# Patient Record
Sex: Male | Born: 1975 | Race: White | Hispanic: No | Marital: Single | Smoking: Current some day smoker
Health system: Southern US, Community
[De-identification: ages and names within clinical notes are randomized; demographics above are authoritative.]

## PROBLEM LIST (undated history)

## (undated) ENCOUNTER — Ambulatory Visit: Payer: No Typology Code available for payment source

## (undated) DIAGNOSIS — E079 Disorder of thyroid, unspecified: Secondary | ICD-10-CM

## (undated) HISTORY — PX: CYST REMOVAL NECK: SHX6281

---

## 2020-07-03 ENCOUNTER — Ambulatory Visit
Admission: EM | Admit: 2020-07-03 | Discharge: 2020-07-03 | Disposition: A | Discharge status: Home / Self Care | Payer: BC Managed Care – PPO | Attending: Emergency Medicine | Admitting: Emergency Medicine

## 2020-07-03 ENCOUNTER — Other Ambulatory Visit: Payer: Self-pay

## 2020-07-03 ENCOUNTER — Ambulatory Visit (INDEPENDENT_AMBULATORY_CARE_PROVIDER_SITE_OTHER): Payer: BC Managed Care – PPO

## 2020-07-03 ENCOUNTER — Encounter: Payer: Self-pay | Admitting: Emergency Medicine

## 2020-07-03 DIAGNOSIS — S8992XA Unspecified injury of left lower leg, initial encounter: Secondary | ICD-10-CM

## 2020-07-03 DIAGNOSIS — M25562 Pain in left knee: Secondary | ICD-10-CM | POA: Diagnosis not present

## 2020-07-03 DIAGNOSIS — S8002XA Contusion of left knee, initial encounter: Secondary | ICD-10-CM | POA: Diagnosis not present

## 2020-07-03 HISTORY — DX: Disorder of thyroid, unspecified: E07.9

## 2020-07-03 MED ORDER — IBUPROFEN 600 MG PO TABS: 600.0000 mg | ORAL_TABLET | Freq: Four times a day (QID) | ORAL | 0 refills | Status: DC | PRN

## 2020-07-03 NOTE — ED Triage Notes (Signed)
Patient in today after injuring his left knee on 06/26/20. 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 Patient took Ibuprofen on the day of the injury.

## 2020-07-03 NOTE — ED Provider Notes (Signed)
HPI  SUBJECTIVE:  Calvin Greene is a 44 y.o. male who presents with left knee pain for the past week.  Reports knee swelling, bruising that is getting better.  States that he was unhooking his trailer from tractor trailer, the trailer slid back and landed directly on his knee/patella.  He denies thigh or distal leg injury. No distal numbness or tingling, joint erythema.  States that he has been able to bear weight on it and bend his knee although it is painful.  He tried 1200 mg of ibuprofen on the day of injury with improvement of symptoms.  Symptoms are worse with palpation, bending his knee.   Past medical history none for diabetes, hypertension, history of left knee injury.  PMD: [facility].  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  Past Medical History:  Diagnosis Date  . Thyroid disease     Past Surgical History:  Procedure Laterality Date  . CYST REMOVAL NECK      Family History  Problem Relation Age of Onset  . Other Mother        unknown medical history  . Other Father        unknown medical history    Social History   Tobacco Use  . Smoking status: Former Smoker    Types: Cigarettes    Quit date: 07/03/2018    Years since quitting: 2.0  . Smokeless tobacco: Never Used  Vaping Use  . Vaping Use: Never used  Substance Use Topics  . Alcohol use: Never  . Drug use: Never    No current facility-administered medications for this encounter.  Current Outpatient Medications:  .  levothyroxine (SYNTHROID) 137 MCG tablet, Take 1 tablet by mouth daily., Disp: , Rfl:  .  ibuprofen (ADVIL) 600 MG tablet, Take 1 tablet (600 mg total) by mouth every 6 (six) hours as needed., Disp: 30 tablet, Rfl: 0  No Known Allergies   ROS  As noted in HPI.   Physical Exam  BP 105/77 (BP Location: Left Arm)   Pulse 73   Temp 98.2 F (36.8 C) (Oral)   Resp 18   Ht 5\' 11"  (1.803 m)   Wt 65.8 kg   SpO2 100%   BMI 20.22 kg/m   Constitutional: Well  developed, well nourished, no acute distress Eyes:  EOMI, conjunctiva normal bilaterally HENT: Normocephalic, atraumatic,mucus membranes moist Respiratory: Normal inspiratory effort Cardiovascular: Normal rate GI: nondistended skin: No rash, skin intact Musculoskeletal: L Knee ROM baseline for Pt, Flexion  intact, Patella tender, swollen, healing bruise, Patellar apprehension test negative, Patellar tendon NT, Medial joint NT, Lateral joint NT, Popliteal region NT, Varus MCL stress testing stable, Valgus LCL stress testing stable, McMurray's testing normal , Lachman's negative. Distal NVI with intact baseline sensation / motor / pulse distal to knee.  No effusion. No erythema. No increased temperature. No crepitus. Pt ambulatory. Neurologic: Alert & oriented x 3, no focal neuro deficits Psychiatric: Speech and behavior appropriate   ED Course   Medications - No data to display  Orders Placed This Encounter  Procedures  . DG Knee Complete 4 Views Left    Standing Status:   Standing    Number of Occurrences:   1    Order Specific Question:   Reason for Exam (SYMPTOM  OR DIAGNOSIS REQUIRED)    Answer:   injury to left knee DOI 06/26/20    No results found for this or any previous visit (from the past 24  hour(s)). DG Knee Complete 4 Views Left  Result Date: 07/03/2020 CLINICAL DATA:  Injury left knee.  the trailer fell on his left leg. EXAM: LEFT KNEE - COMPLETE 4+ VIEW COMPARISON:  None. FINDINGS: No evidence of fracture, dislocation, or joint effusion. No definite lipohemarthrosis. No evidence of severe arthropathy. No aggressive appearing focal bone abnormality. Soft tissues are unremarkable. IMPRESSION: No acute displaced fracture or dislocation. Electronically Signed   By: Tish Frederickson M.D.   On: 07/03/2020 15:38    ED Clinical Impression  1. Contusion of left knee, initial encounter   2. Injury of left knee, initial encounter      ED Assessment/Plan  Suspect patellar  fracture.  He has no other joint tenderness.  Will image knee.  [place] Narcotic database reviewed for this patient, and feel that the risk/benefit ratio today is favorable for proceeding with a prescription for controlled substance.  Reviewed imaging independently.  Radiology report not coming through-contacted radiology.  No fracture or dislocation as read by me and confirmed by radiology.    Discussed  imaging, MDM, treatment plan, and plan for follow-up with patient. Discussed sn/sx that should prompt return to the ED. patient agrees with plan.   Meds ordered this encounter  Medications  . ibuprofen (ADVIL) 600 MG tablet    Sig: Take 1 tablet (600 mg total) by mouth every 6 (six) hours as needed.    Dispense:  30 tablet    Refill:  0    *This clinic note was created using Scientist, clinical (histocompatibility and immunogenetics). Therefore, there may be occasional mistakes despite careful proofreading.   ?    Domenick Gong, MD 07/03/20 [phone]

## 2020-07-03 NOTE — Discharge Instructions (Addendum)
Your X ray is negative for fracture. Take 600 mg or ibuprofen with 1000 mg of tylenol 3-4 times a day as needed for pain. Continue ice. Follow up with ortho if not better in several weeks- you could have a ligamentous or other injury that does not show up on x ray.

## 2021-04-08 IMAGING — CR DG KNEE COMPLETE 4+V*L*
4 series · 4 of 4 positions shown · non-contrast
Comparison: None.

CLINICAL DATA: Injury left knee.  the trailer fell on his left leg.

EXAM:
LEFT KNEE - COMPLETE 4+ VIEW

[knee ap]
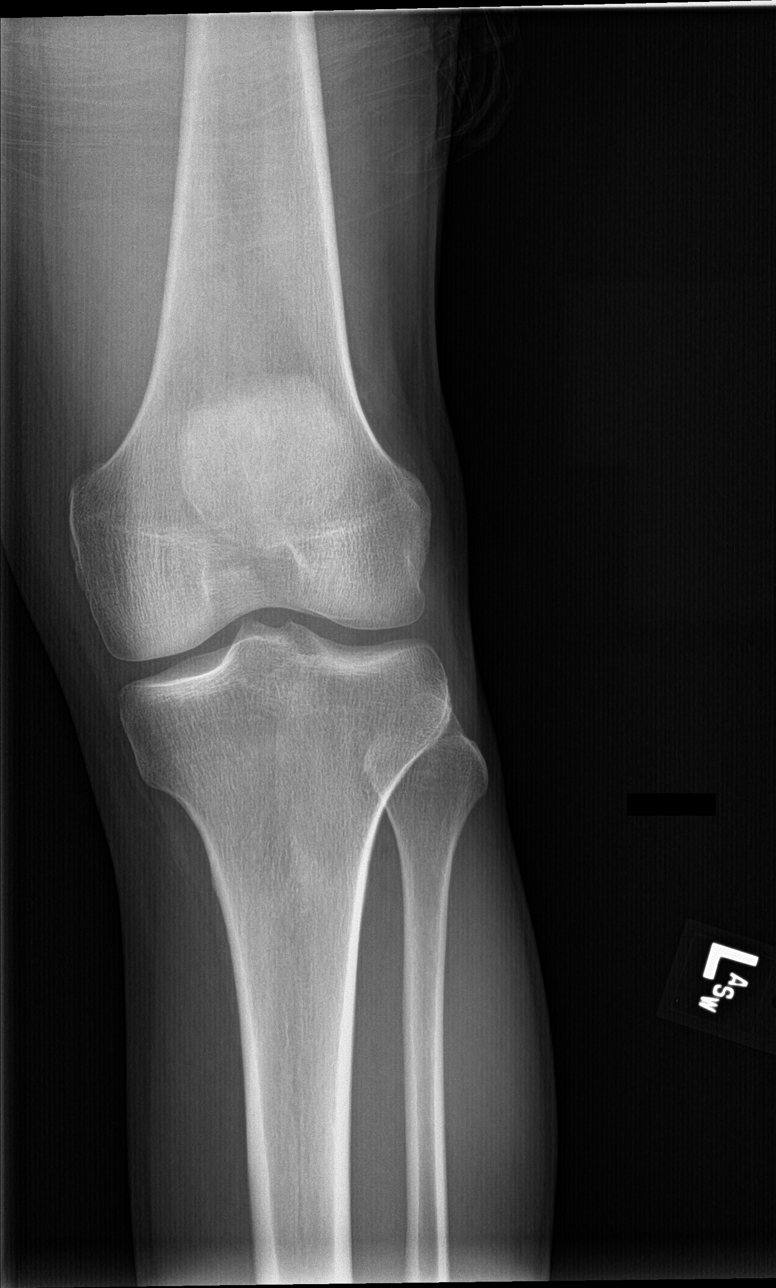

[knee lat]
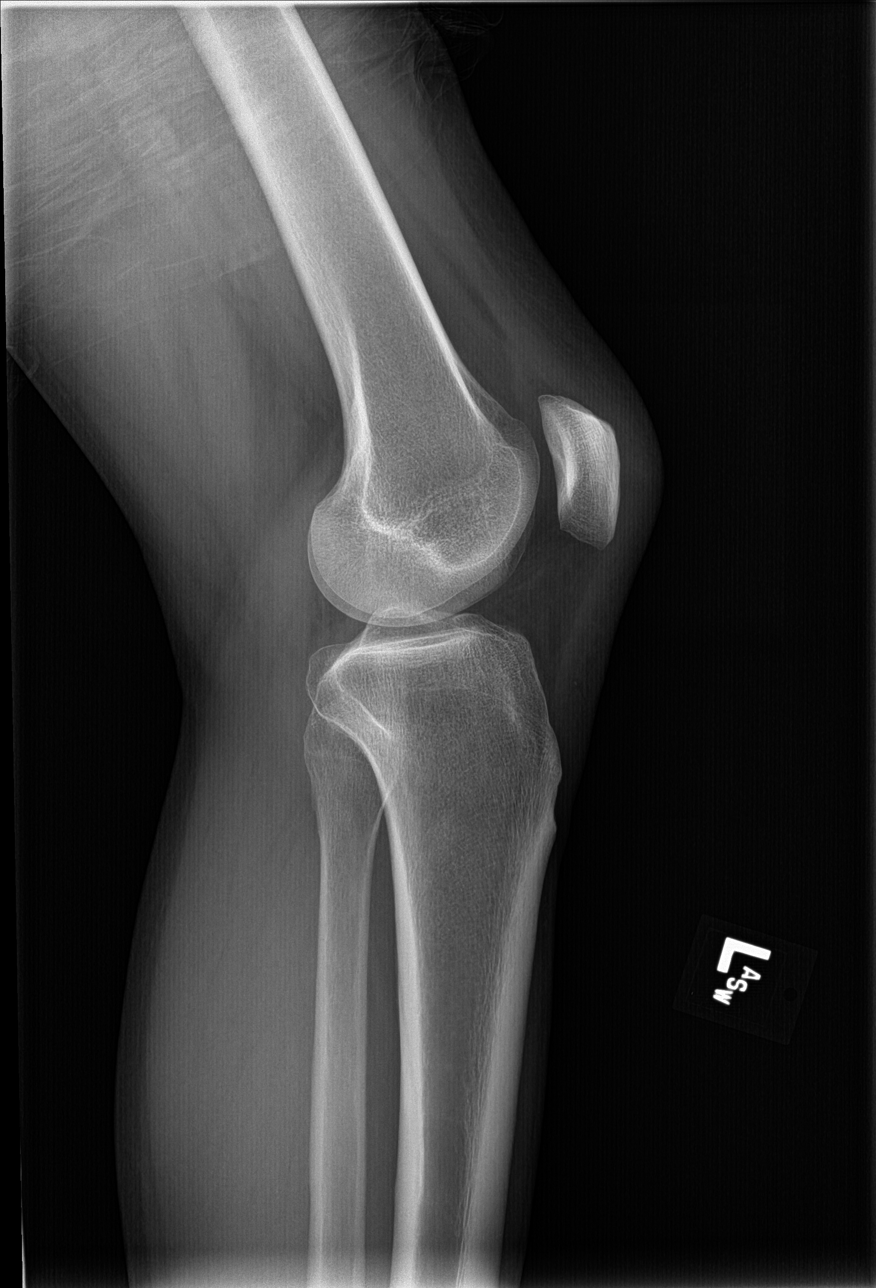

[tunnel]
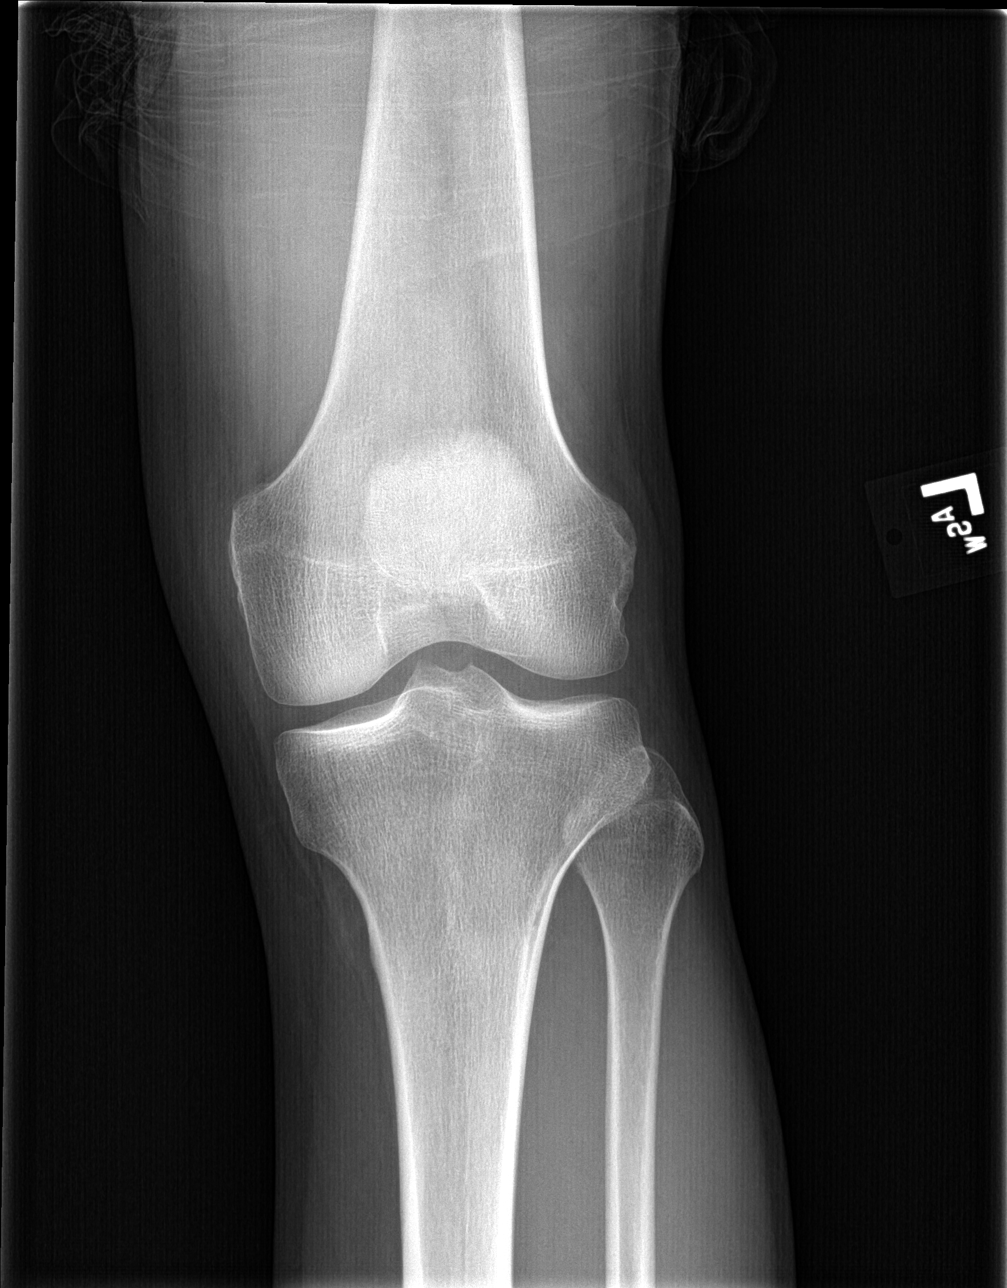

[patella skyline]
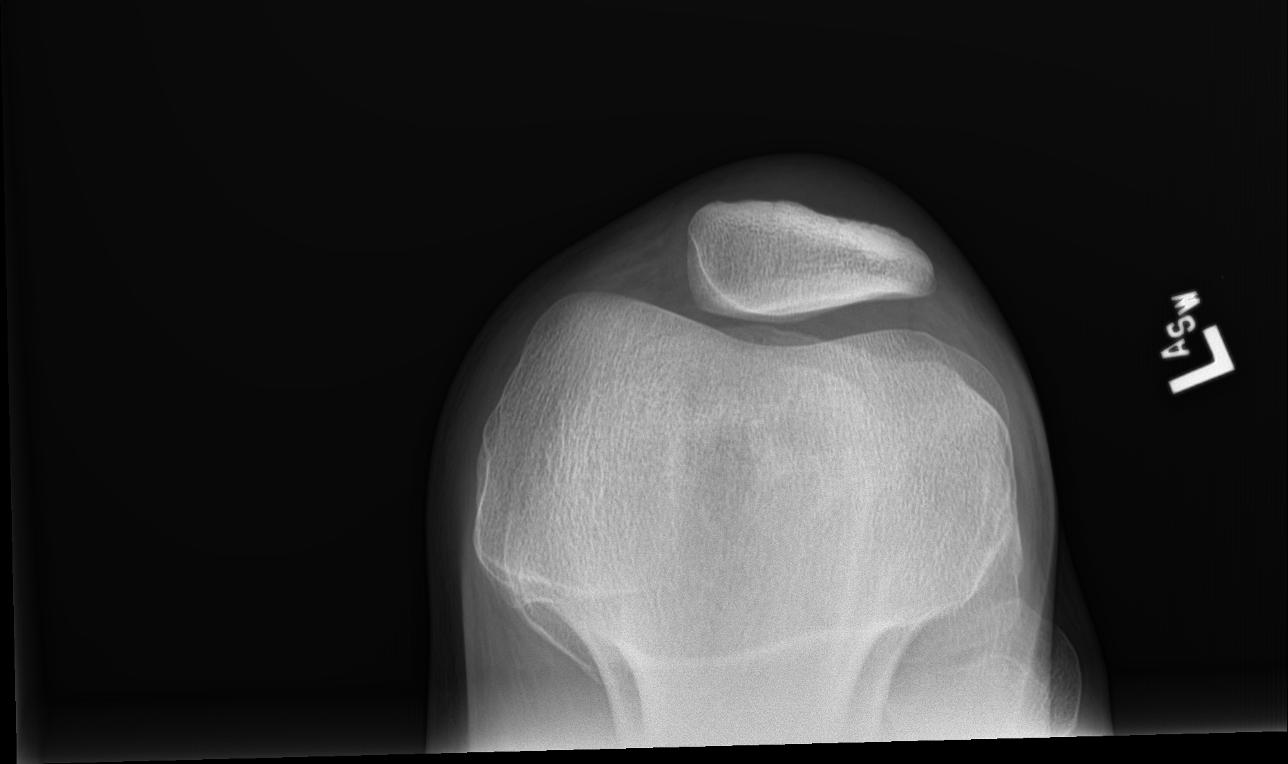

[4 of 4 positions shown; findings below may reference images not displayed]

FINDINGS: No evidence of fracture, dislocation, or joint effusion. No definite
lipohemarthrosis. No evidence of severe arthropathy. No aggressive
appearing focal bone abnormality. Soft tissues are unremarkable.
IMPRESSION: No acute displaced fracture or dislocation.

## 2021-12-27 ENCOUNTER — Ambulatory Visit
Admission: EM | Admit: 2021-12-27 | Discharge: 2021-12-27 | Disposition: A | Discharge status: Home / Self Care | Payer: No Typology Code available for payment source | Attending: Emergency Medicine | Admitting: Emergency Medicine

## 2021-12-27 ENCOUNTER — Encounter: Payer: Self-pay | Admitting: Emergency Medicine

## 2021-12-27 DIAGNOSIS — L03317 Cellulitis of buttock: Secondary | ICD-10-CM

## 2021-12-27 MED ORDER — IBUPROFEN 600 MG PO TABS: 600.0000 mg | ORAL_TABLET | Freq: Four times a day (QID) | ORAL | 0 refills | Status: DC | PRN

## 2021-12-27 MED ORDER — DOXYCYCLINE HYCLATE 100 MG PO CAPS: 100.0000 mg | ORAL_CAPSULE | Freq: Two times a day (BID) | ORAL | 0 refills | Status: AC

## 2021-12-27 NOTE — ED Provider Notes (Signed)
HPI  SUBJECTIVE:  Calvin Greene is a 46 y.o. male who presents with painful erythematous area of increasing size on his left buttock starting 2 days ago.  He reports swelling, purulent odorous discharge.  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  No fevers, body aches.  He tried popping it by sitting down forcefully, which  made things worse.  No alleviating factors.  He has not tried anything else for symptoms.  He has a past medical history of MRSA, hypothyroidism.  PCP: [facility].   Past Medical History:  Diagnosis Date   Thyroid disease     Past Surgical History:  Procedure Laterality Date   CYST REMOVAL NECK      Family History  Problem Relation Age of Onset   Other Mother        unknown medical history   Other Father        unknown medical history    Social History   Tobacco Use   Smoking status: Former    Types: Cigarettes    Quit date: 07/03/2018    Years since quitting: 3.4   Smokeless tobacco: Never  Vaping Use   Vaping Use: Never used  Substance Use Topics   Alcohol use: Never   Drug use: Never    No current facility-administered medications for this encounter.  Current Outpatient Medications:    doxycycline (VIBRAMYCIN) 100 MG capsule, Take 1 capsule (100 mg total) by mouth 2 (two) times daily for 7 days., Disp: 14 capsule, Rfl: 0   ibuprofen (ADVIL) 600 MG tablet, Take 1 tablet (600 mg total) by mouth every 6 (six) hours as needed., Disp: 30 tablet, Rfl: 0   levothyroxine (SYNTHROID) 137 MCG tablet, Take 1 tablet by mouth daily., Disp: , Rfl:   No Known Allergies   ROS  As noted in HPI.   Physical Exam  BP 127/85 (BP Location: Left Arm)   Pulse 63   Temp 98.6 F (37 C) (Oral)   Resp 15   Ht 5\' 11"  (1.803 m)   Wt 70.3 kg   SpO2 100%   BMI 21.62 kg/m   Constitutional: Well developed, well nourished, no acute distress Eyes:  EOMI, conjunctiva normal bilaterally HENT:  Normocephalic, atraumatic,mucus membranes moist Respiratory: Normal inspiratory effort Cardiovascular: Normal rate GI: nondistended skin: 8.5 x 5 cm nontender area of blanchable erythema left buttock.  Marked this with a solid line.  4 x 2 cm tender area of induration without central fluctuance.  No expressible purulent drainage.  Marked this with a dotted line.     Musculoskeletal: no deformities Neurologic: Alert & oriented x 3, no focal neuro deficits Psychiatric: Speech and behavior appropriate   ED Course   Medications - No data to display  No orders of the defined types were placed in this encounter.   No results found for this or any previous visit (from the past 24 hour(s)). No results found.  ED Clinical Impression  1. Cellulitis of buttock, left      ED Assessment/Plan  Patient with cellulitis of the left buttock.  There does not appear to be an abscess at this time.  I was unable to appreciate any fluctuance that would suggest an abscess.  will send him home with doxycycline for 1 week due to history of MRSA, Tylenol/ibuprofen, advised heating pad.  He can return here or see his primary care provider if  it does not get any better, and then we can reevaluate and do an I&D if necessary at that time.  ER return precautions given.  Discussed MDM, treatment plan, and plan for follow-up with patient. Discussed sn/sx that should prompt return to the ED. patient agrees with plan.   Meds ordered this encounter  Medications   ibuprofen (ADVIL) 600 MG tablet    Sig: Take 1 tablet (600 mg total) by mouth every 6 (six) hours as needed.    Dispense:  30 tablet    Refill:  0   doxycycline (VIBRAMYCIN) 100 MG capsule    Sig: Take 1 capsule (100 mg total) by mouth 2 (two) times daily for 7 days.    Dispense:  14 capsule    Refill:  0      *This clinic note was created using Scientist, clinical (histocompatibility and immunogenetics). Therefore, there may be occasional mistakes despite careful  proofreading.  ?    Domenick Gong, MD 12/27/21 1102

## 2021-12-27 NOTE — ED Triage Notes (Signed)
Patient states that he has an abscess on his left buttock since Friday.  Patient reports some drainage from the site.

## 2021-12-27 NOTE — Discharge Instructions (Addendum)
I suspect that this is a MRSA infection.  However, there does not appear to be anything to drain today.  Take 600 mg of ibuprofen combined with 1000 mg of Tylenol together 3-4 times a day as needed for pain, and use a heating pad as often as you can.  Finish doxycycline, even if you get better.  Return here or see your doctor if it is not getting any better, we can reevaluate for an abscess to drain.  Go to the ER if you get significantly worse, fevers, body aches, or other concerns.

## 2021-12-29 ENCOUNTER — Ambulatory Visit: Payer: Self-pay | Admitting: Urology

## 2022-01-01 ENCOUNTER — Ambulatory Visit: Payer: Self-pay | Admitting: Urology

## 2022-10-23 ENCOUNTER — Ambulatory Visit
Admission: EM | Admit: 2022-10-23 | Discharge: 2022-10-23 | Disposition: A | Discharge status: Home / Self Care | Payer: No Typology Code available for payment source | Attending: Family Medicine | Admitting: Family Medicine

## 2022-10-23 DIAGNOSIS — L0231 Cutaneous abscess of buttock: Secondary | ICD-10-CM | POA: Diagnosis not present

## 2022-10-23 MED ORDER — AMOXICILLIN-POT CLAVULANATE 875-125 MG PO TABS: 1.0000 | ORAL_TABLET | Freq: Two times a day (BID) | ORAL | 1 refills | Status: DC

## 2022-10-23 NOTE — ED Triage Notes (Signed)
Pt states that he has an abscess on his left buttock.  Pt states that he has had a history of this before.

## 2022-10-23 NOTE — Discharge Instructions (Addendum)
Keep the area clean.   Use Hibiclens to prevent recurrence.  I do not recommend driving for a few days.

## 2022-10-23 NOTE — ED Provider Notes (Signed)
[facility]    CSN: [id number] Arrival date & time: 10/23/22  4098      History   Chief Complaint Chief Complaint  Patient presents with   Abscess    Abscess on left buttock.     HPI  47 year old male with a history of abscess presents with abscess.  Patient has an abscess to his left buttock.  Started a few days ago.  Red and inflamed.  He states that his pain is 10/10 in severity.  However, he is pleasant and smiling and does not appear to be in any acute distress.  No fever.  No drainage from the area.  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  Home Medications    Prior to Admission medications   Medication Sig Start Date End Date Taking? Authorizing Provider  amoxicillin-clavulanate (AUGMENTIN) 875-125 MG tablet Take 1 tablet by mouth every 12 (twelve) hours. 10/23/22  Yes Margie Urbanowicz G, DO  ibuprofen (ADVIL) 600 MG tablet Take 1 tablet (600 mg total) by mouth every 6 (six) hours as needed. 12/27/21   Domenick Gong, MD  levothyroxine (SYNTHROID) 137 MCG tablet Take 1 tablet by mouth daily.    [provider]    Family History Family History  Problem Relation Age of Onset   Other Mother        unknown medical history   Other Father        unknown medical history    Social History Social History   Tobacco Use   Smoking status: Some Days    Types: Cigarettes   Smokeless tobacco: Never  Vaping Use   Vaping Use: Never used  Substance Use Topics   Alcohol use: Never   Drug use: Never     Allergies   Patient has no known allergies.   Review of Systems Review of Systems Per HPI  Physical Exam Triage Vital Signs ED Triage Vitals  Enc Vitals Group     BP 10/23/22 0947 (!) 120/90     Pulse Rate 10/23/22 0947 84     Resp 10/23/22 0947 18     Temp 10/23/22 0947 98.6 F (37 C)     Temp Source 10/23/22 0947 Oral     SpO2 10/23/22 0947 97 %     Weight 10/23/22 0946 155 lb (70.3 kg)     Height 10/23/22 0946   (1.803 m)     Head Circumference --      Peak Flow --      Pain Score 10/23/22 0945 10     Pain Loc --      Pain Edu? --      Excl. in GC? --    No data found.  Updated Vital Signs BP (!) 120/90 (BP Location: Left Arm)   Pulse 84   Temp 98.6 F (37 C) (Oral)   Resp 18   Ht  (1.803 m)   Wt 70.3 kg   SpO2 97%   BMI 21.62 kg/m   Visual Acuity Right Eye Distance:   Left Eye Distance:   Bilateral Distance:    Right Eye Near:   Left Eye Near:    Bilateral Near:     Physical Exam Vitals and nursing note reviewed.  Constitutional:      General: He is not in acute distress.    Appearance: Normal appearance.  HENT:     Head: Normocephalic and atraumatic.  Pulmonary:     Effort:  Pulmonary effort is normal. No respiratory distress.  Skin:    Comments: Left medial buttock abscess.  Tender to palpation.  Fluctuant.  Mild erythema.  Neurological:     Mental Status: He is alert.  Psychiatric:        Mood and Affect: Mood normal.        Behavior: Behavior normal.      UC Treatments / Results  Labs (all labs ordered are listed, but only abnormal results are displayed) Labs Reviewed - No data to display  EKG   Radiology No results found.  Procedures Incision and Drainage  Date/Time: 10/23/2022 11:44 AM  Performed by: Tommie Sams, DO Authorized by: Tommie Sams, DO   Consent:    Consent obtained:  Verbal   Consent given by:  Patient   Risks discussed:  Pain   Alternatives discussed:  Alternative treatment Location:    Type:  Abscess   Location:  Lower extremity   Lower extremity location:  Buttock   Buttock location:  L buttock Pre-procedure details:    Skin preparation:  Povidone-iodine Anesthesia:    Anesthesia method:  Local infiltration   Local anesthetic:  Lidocaine 1% WITH epi Procedure type:    Complexity:  Simple Procedure details:    Ultrasound guidance: no     Needle aspiration: no     Incision types:  Stab incision   Wound  management:  Probed and deloculated   Drainage:  Bloody and purulent   Drainage amount:  Copious   Wound treatment:  Wound left open   Packing materials:  None Post-procedure details:    Procedure completion:  Tolerated well, no immediate complications  (including critical care time)  Medications Ordered in UC Medications - No data to display  Initial Impression / Assessment and Plan / UC Course  I have reviewed the triage vital signs and the nursing notes.  Pertinent labs & imaging results that were available during my care of the patient were reviewed by me and considered in my medical decision making (see chart for details).    47 year old male presents with abscess.  Incision and drainage performed today.  Tolerated well.  Placed on Augmentin.  Advised to use Hibiclens to prevent recurrence.  Advised to take some time off of work.  Work note given.  Final Clinical Impressions(s) / UC Diagnoses   Final diagnoses:  Left buttock abscess     Discharge Instructions      Keep the area clean.   Use Hibiclens to prevent recurrence.  I do not recommend driving for a few days.   ED Prescriptions     Medication Sig Dispense Auth. Provider   amoxicillin-clavulanate (AUGMENTIN) 875-125 MG tablet Take 1 tablet by mouth every 12 (twelve) hours. 14 tablet Tommie Sams, DO      PDMP not reviewed this encounter.   Tommie Sams, [place] 10/23/22 1146

## 2023-06-21 ENCOUNTER — Other Ambulatory Visit: Payer: Self-pay | Admitting: Family Medicine

## 2023-07-27 ENCOUNTER — Ambulatory Visit
Admission: EM | Admit: 2023-07-27 | Discharge: 2023-07-27 | Disposition: A | Discharge status: Home / Self Care | Payer: Self-pay | Attending: Emergency Medicine | Admitting: Emergency Medicine

## 2023-07-27 DIAGNOSIS — L0231 Cutaneous abscess of buttock: Secondary | ICD-10-CM

## 2023-07-27 MED ORDER — DOXYCYCLINE HYCLATE 100 MG PO CAPS: 100.0000 mg | ORAL_CAPSULE | Freq: Two times a day (BID) | ORAL | 0 refills | Status: AC

## 2023-07-27 NOTE — Discharge Instructions (Signed)
 Take the doxycycline  twice daily with food for 10 days for the abscess in your left buttock.  You may apply warm compresses to your left buttock for 20 minutes at a time, 2-3 times a day, to see if you can get the abscess to come to the surface and rupture on its own.  Because this abscess is so deep, and it is recurring, I have referred you to general surgery for definitive management.  They will contact you to schedule an appointment.

## 2023-07-27 NOTE — ED Triage Notes (Signed)
 Pt states "that boil thing or whatever came back, he said it would." Patient states it is on the left butt cheek and was drained last time he was here.

## 2023-07-27 NOTE — ED Provider Notes (Signed)
 [facility]    CSN: [id number] Arrival date & time: 07/27/23  1251      History   Chief Complaint Chief Complaint  Patient presents with   Abscess    HPI Calvin Greene is a 48 y.o. male.   HPI  48 year old male with past medical history significant for thyroid disease presents for evaluation of possible boil on his left buttock.  He was seen in this urgent care on 10/23/2022 for something similar and had an I&D performed at that time.  1 year prior he was also seen for cellulitis of his left buttock.  He is unsure of why this keeps coming back.  He denies any fever or drainage from the area.  This current episode started to flare approximately 2 weeks ago.  Past Medical History:  Diagnosis Date   Thyroid disease     There are no active problems to display for this patient.   Past Surgical History:  Procedure Laterality Date   CYST REMOVAL NECK         Home Medications    Prior to Admission medications   Medication Sig Start Date End Date Taking? Authorizing Provider  doxycycline  (VIBRAMYCIN ) 100 MG capsule Take 1 capsule (100 mg total) by mouth 2 (two) times daily for 10 days. 07/27/23 08/06/23 Yes Kent Pear, NP  levothyroxine (SYNTHROID) 137 MCG tablet Take 1 tablet by mouth daily.    [provider]    Family History Family History  Problem Relation Age of Onset   Other Mother        unknown medical history   Other Father        unknown medical history    Social History Social History   Tobacco Use   Smoking status: Some Days    Types: Cigarettes   Smokeless tobacco: Never  Vaping Use   Vaping status: Never Used  Substance Use Topics   Alcohol use: Never   Drug use: Never     Allergies   Patient has no known allergies.   Review of Systems Review of Systems  Constitutional:  Negative for fever.  Musculoskeletal:        Pain and swelling to left medial buttock     Physical Exam Triage Vital Signs ED Triage  Vitals  Encounter Vitals Group     BP      Systolic BP Percentile      Diastolic BP Percentile      Pulse      Resp      Temp      Temp src      SpO2      Weight      Height      Head Circumference      Peak Flow      Pain Score      Pain Loc      Pain Education      Exclude from Growth Chart    No data found.  Updated Vital Signs BP 124/76 (BP Location: Left Arm)   Pulse 67   Temp 98.4 F (36.9 C) (Oral)   Resp 18   SpO2 98%   Visual Acuity Right Eye Distance:   Left Eye Distance:   Bilateral Distance:    Right Eye Near:   Left Eye Near:    Bilateral Near:     Physical Exam Vitals and nursing note reviewed.  Constitutional:      Appearance: Normal appearance. He is not ill-appearing.  HENT:  Head: Normocephalic and atraumatic.  Skin:    General: Skin is warm and dry.     Capillary Refill: Capillary refill takes less than 2 seconds.     Findings: Erythema present.  Neurological:     General: No focal deficit present.     Mental Status: He is alert and oriented to person, place, and time.      UC Treatments / Results  Labs (all labs ordered are listed, but only abnormal results are displayed) Labs Reviewed - No data to display  EKG   Radiology No results found.  Procedures Procedures (including critical care time)  Medications Ordered in UC Medications - No data to display  Initial Impression / Assessment and Plan / UC Course  I have reviewed the triage vital signs and the nursing notes.  Pertinent labs & imaging results that were available during my care of the patient were reviewed by me and considered in my medical decision making (see chart for details).   Patient is a pleasant, nontoxic-appearing 68 old male presenting for evaluation of possible abscess to the left buttock.  As you can see in image above, the patient has a erythematous patch on the inferior medial aspect of his left buttock.  With retraction of the tissue a oblong  area of edema is visible though not with the tissue at rest.  There is no induration or fluctuance.  I am concerned that this is a deep abscess, or possible ruptured deep sebaceous cyst.  I will refer the patient to general surgery for further evaluation and treatment and discharge him home on doxycycline  100 mg twice daily with food for 10 days.  Final Clinical Impressions(s) / UC Diagnoses   Final diagnoses:  Abscess of buttock, left     Discharge Instructions      Take the doxycycline  twice daily with food for 10 days for the abscess in your left buttock.  You may apply warm compresses to your left buttock for 20 minutes at a time, 2-3 times a day, to see if you can get the abscess to come to the surface and rupture on its own.  Because this abscess is so deep, and it is recurring, I have referred you to general surgery for definitive management.  They will contact you to schedule an appointment.     ED Prescriptions     Medication Sig Dispense Auth. Provider   doxycycline  (VIBRAMYCIN ) 100 MG capsule Take 1 capsule (100 mg total) by mouth 2 (two) times daily for 10 days. 20 capsule Kent Pear, NP      PDMP not reviewed this encounter.   Kent Pear, NP 07/27/23 [phone]

## 2024-04-15 ENCOUNTER — Emergency Department
Admission: EM | Admit: 2024-04-15 | Discharge: 2024-04-15 | Disposition: A | Discharge status: Home / Self Care | Attending: Emergency Medicine | Admitting: Emergency Medicine

## 2024-04-15 ENCOUNTER — Emergency Department

## 2024-04-15 ENCOUNTER — Other Ambulatory Visit: Payer: Self-pay

## 2024-04-15 DIAGNOSIS — W208XXA Other cause of strike by thrown, projected or falling object, initial encounter: Secondary | ICD-10-CM | POA: Insufficient documentation

## 2024-04-15 DIAGNOSIS — S99922A Unspecified injury of left foot, initial encounter: Secondary | ICD-10-CM | POA: Insufficient documentation

## 2024-04-15 NOTE — ED Triage Notes (Signed)
 Pt presents with a L foot injury after dropping a glass bottle on it that subsequently shattered today.

## 2024-04-15 NOTE — ED Provider Notes (Signed)
   [facility] Provider Note    Event Date/Time   First MD Initiated Contact with Patient 04/15/24 1229     (approximate)   History   Foot Injury   HPI  Calvin Greene is a 48 y.o. male who presents with complaints of laceration to the distal left third toe which occurred because a bottle fell on his foot.  Tetanus is up-to-date     Physical Exam   Triage Vital Signs: ED Triage Vitals  Encounter Vitals Group     BP 04/15/24 1210 (!) 127/92     Girls Systolic BP Percentile --      Girls Diastolic BP Percentile --      Boys Systolic BP Percentile --      Boys Diastolic BP Percentile --      Pulse Rate 04/15/24 1210 92     Resp 04/15/24 1210 16     Temp 04/15/24 1210 98.7 F (37.1 C)     Temp Source 04/15/24 1210 Oral     SpO2 04/15/24 1210 95 %     Weight 04/15/24 1210 77.1 kg (170 lb)     Height 04/15/24 1210 1.803 m (5' 11)     Head Circumference --      Peak Flow --      Pain Score 04/15/24 1212 0     Pain Loc --      Pain Education --      Exclude from Growth Chart --     Most recent vital signs: Vitals:   04/15/24 1210  BP: (!) 127/92  Pulse: 92  Resp: 16  Temp: 98.7 F (37.1 C)  SpO2: 95%     General: Awake, no distress.  CV:  Good peripheral perfusion.  Resp:  Normal effort.  Abd:  No distention.  Other:  Avulsion injury to a flap of skin distal left third toe, bleeding controlled, toenail intact   ED Results / Procedures / Treatments   Labs (all labs ordered are listed, but only abnormal results are displayed) Labs Reviewed - No data to display   EKG     RADIOLOGY     PROCEDURES:  Critical Care performed:   Procedures   MEDICATIONS ORDERED IN ED: Medications - No data to display   IMPRESSION / MDM / ASSESSMENT AND PLAN / ED COURSE  I reviewed the triage vital signs and the nursing notes. Patient's presentation is most consistent with acute complicated illness / injury requiring diagnostic  workup.  X-ray negative for fracture.  Tetanus up-to-date.  Steri-Strips and wound care provided, no indication for suturing        FINAL CLINICAL IMPRESSION(S) / ED DIAGNOSES   Final diagnoses:  Toe injury, left, initial encounter     Rx / DC Orders   ED Discharge Orders     None        Note:  This document was prepared using Dragon voice recognition software and may include unintentional dictation errors.   Arlander Charleston, MD 04/15/24 1318

## 2024-04-15 NOTE — ED Notes (Signed)
 ED Provider at bedside.

## 2024-04-16 ENCOUNTER — Ambulatory Visit: Payer: Self-pay | Admitting: Surgery

## 2024-04-16 ENCOUNTER — Encounter: Payer: Self-pay | Admitting: Surgery

## 2024-04-16 VITALS — BP 106/71 | HR 69 | Ht 71.0 in | Wt 172.0 lb

## 2024-04-16 DIAGNOSIS — L03317 Cellulitis of buttock: Secondary | ICD-10-CM

## 2024-04-16 DIAGNOSIS — L0231 Cutaneous abscess of buttock: Secondary | ICD-10-CM | POA: Diagnosis not present

## 2024-04-16 MED ORDER — AMOXICILLIN-POT CLAVULANATE 875-125 MG PO TABS: 1.0000 | ORAL_TABLET | Freq: Two times a day (BID) | ORAL | 0 refills | Status: DC

## 2024-04-16 NOTE — Patient Instructions (Addendum)
 We will prescribe you antibiotics to reduce the inflammation and shrink this area.   We will have you follow up here in 10 days.     If the area continues to get worse do call us  so we may see you sooner.   Skin Abscess  A skin abscess is an infected area on or under your skin. It contains pus and other material. An abscess may also be called a furuncle, carbuncle, or boil. It is often the result of an infection caused by bacteria. An abscess can occur in or on almost any part of your body. Sometimes, an abscess may break open (rupture) on its own. In most cases, it will keep getting worse unless it is treated. An abscess can cause pain and make you feel ill. An untreated abscess can cause infection to spread to other parts of your body or your bloodstream. The abscess may need to be drained. You may also need to take antibiotics. What are the causes? An abscess occurs when germs, like bacteria, pass through your skin and cause an infection. This may be caused by: A scrape or cut on your skin. A puncture wound through your skin, such as a needle injection or insect bite. Blocked oil or sweat glands. Blocked and infected hair follicles. A fluid-filled sac that forms beneath your skin (sebaceous cyst) and becomes infected. What increases the risk? You may be more likely to develop an abscess if: You have problems with blood circulation, or you have a weak body defense system (immune system). You have diabetes. You have dry and irritated skin. You get injections often or use IV drugs. You have a foreign body in a wound, such as a splinter. You smoke or use tobacco products. What are the signs or symptoms? Symptoms of this condition include: A painful, firm bump under the skin. A bump with pus at the top. This may break through the skin and drain. Other symptoms include: Redness and swelling around the abscess. Warmth or tenderness. Swelling of the lymph nodes (glands) near the  abscess. A sore on the skin. How is this diagnosed? This condition may be diagnosed based on a physical exam and your medical history. You may also have tests done, such as: A test of a sample of pus. This may be done to find what is causing the infection. Blood tests. Imaging tests, such as an ultrasound, CT scan, or MRI. How is this treated? A small abscess that drains on its own may not need to be treated. Treatment for larger abscesses may include: Moist heat or a heat pack applied to the area a few times a day. Incision and drainage. This is a procedure to drain the abscess. Antibiotics. For a severe abscess, you may first get antibiotics through an IV and then change to antibiotics by mouth. Follow these instructions at home: Medicines Take over-the-counter and prescription medicines only as told by your provider. If you were prescribed antibiotics, take them as told by your provider. Do not stop using the antibiotic even if you start to feel better. Abscess care  If you have an abscess that has not drained, apply heat to the affected area. Use the heat source that your provider recommends, such as a moist heat pack or a heating pad. Place a towel between your skin and the heat source. Leave the heat on for 20-30 minutes at a time. If your skin turns bright red, remove the heat right away to prevent burns. The risk of  burns is higher if you cannot feel pain, heat, or cold. Follow instructions from your provider about how to take care of your abscess. Make sure you: Cover the abscess with a bandage (dressing). Wash your hands with soap and water for at least 20 seconds before and after you change the dressing or gauze. If soap and water are not available, use hand sanitizer. Change your dressing or gauze as told by your provider. Check your abscess every day for signs of an infection that is getting worse. Check for: More redness, swelling, pain, or tenderness. More fluid or  blood. Warmth. More pus or a worse smell. General instructions To avoid spreading the infection: Do not share personal care items, towels, or hot tubs with others. Avoid making skin contact with other people. Be careful when getting rid of used dressings, wound packing, or any drainage from the abscess. Do not use any products that contain nicotine or tobacco. These products include cigarettes, chewing tobacco, and vaping devices, such as e-cigarettes. If you need help quitting, ask your provider. Do not use any creams, ointments, or liquids unless you have been told to by your provider. Contact a health care provider if: You see redness that spreads quickly or red streaks on your skin spreading away from the abscess. You have any signs of worse infection at the abscess. You vomit every time you eat or drink. You have a fever, chills, or muscle aches. The cyst or abscess returns. Get help right away if: You have severe pain. You make less pee (urine) than normal. This information is not intended to replace advice given to you by your health care provider. Make sure you discuss any questions you have with your health care provider. Document Revised: 02/10/2022 Document Reviewed: 02/10/2022 Elsevier Patient Education  2024 [employer].

## 2024-04-16 NOTE — Progress Notes (Signed)
 04/16/2024  Reason for Visit: Recurrent left buttocks abscess  History of Present Illness: Calvin Greene is a 48 y.o. male presenting for evaluation of a recurrent left buttocks abscess.  The patient reports he has had a prior I&D of this area in the past, thought I cannot find any records of this.  He reports he was told this would likely come back.  He went to Urgent Care on 07/27/23 for flare-up of this area and was treated with Doxycycline  and did not need I&D at that time.  He was referred to us , but he felt that with the antibiotics the area significantly improved and could barely feel any remnant.  He also lost his job and insurance so he did not set up an appointment.  More recently, he reports that the area is getting more tender again and he had to adjust how he sits down due to the discomfort.  Denies any drainage, but reports the area is more swollen.  Past Medical History: Past Medical History:  Diagnosis Date   Thyroid disease      Past Surgical History: Past Surgical History:  Procedure Laterality Date   CYST REMOVAL NECK      Home Medications: Prior to Admission medications   Medication Sig Start Date End Date Taking? Authorizing Provider  amoxicillin -clavulanate (AUGMENTIN ) 875-125 MG tablet Take 1 tablet by mouth 2 (two) times daily for 10 days. 04/16/24 04/26/24 Yes Mattie Novosel, Aloysius, MD  levothyroxine (SYNTHROID) 75 MCG tablet Take 75 mcg by mouth daily.   Yes [provider]    Allergies: No Known Allergies  Social History:  reports that he has been smoking cigarettes. He has been exposed to tobacco smoke. He has never used smokeless tobacco. He reports that he does not drink alcohol and does not use drugs.   Family History: Family History  Problem Relation Age of Onset   Other Mother        unknown medical history   Other Father        unknown medical history    Review of Systems: Review of Systems  Constitutional:  Negative for chills and fever.   Respiratory:  Negative for shortness of breath.   Cardiovascular:  Negative for chest pain.  Gastrointestinal:  Negative for nausea and vomiting.  Skin:        Left buttocks pain/redness    Physical Exam BP 106/71   Pulse 69   Ht 5' 11 (1.803 m)   Wt 172 lb (78 kg)   SpO2 98%   BMI 23.99 kg/m  CONSTITUTIONAL: No acute distress HEENT:  Normocephalic, atraumatic, extraocular motion intact. RESPIRATORY:  Normal respiratory effort without pathologic use of accessory muscles. CARDIOVASCULAR: Regular rhythm and rate. MUSCULOSKELETAL:  Normal muscle strength and tone in all four extremities.  No peripheral edema or cyanosis. SKIN: The patient has an area of induration in the left buttocks measuring about 3.5 cm in size consistent with the prior area where the patient has had an abscess.  Scar from prior I&D is well-healed.  No fluctuance and no drainage at this point.  This area is away from the anal verge. NEUROLOGIC:  Motor and sensation is grossly normal.  Cranial nerves are grossly intact. PSYCH:  Alert and oriented to person, place and time. Affect is normal.  Laboratory Analysis: No results found for this or any previous visit (from the past 24 hours).  Imaging: No results found.  Assessment and Plan: This is a 48 y.o. male with a recurrent left  buttocks abscess.  -Discussed with the patient the findings on exam today.  Overall the area in the left buttocks seems to be away/isolated from the anal canal.  There is erythema and induration, but no significant fluctuance yet to warrant I&D.  Discussed with him that we would start him on antibiotics to cool this area down with plans for eventual cyst excision to prevent further recurrences.   --Patient reports that he did not feel that the prior antibiotic was too helpful.  Just in case, will change the antibiotic to Augmentin , 10 day course.  --Follow up with me in 10 days to reassess the area vs sooner if there is any  worsening.  I spent 30 minutes dedicated to the care of this patient on the date of this encounter to include pre-visit review of records, face-to-face time with the patient discussing diagnosis and management, and any post-visit coordination of care.   Aloysius Sheree Plant, MD [facility]

## 2024-04-27 ENCOUNTER — Encounter: Payer: Self-pay | Admitting: Surgery

## 2024-04-27 ENCOUNTER — Ambulatory Visit: Admitting: Surgery

## 2024-04-27 VITALS — BP 115/73 | HR 76 | Ht 71.0 in | Wt 175.0 lb

## 2024-04-27 DIAGNOSIS — L0231 Cutaneous abscess of buttock: Secondary | ICD-10-CM | POA: Diagnosis not present

## 2024-04-27 DIAGNOSIS — L03317 Cellulitis of buttock: Secondary | ICD-10-CM

## 2024-04-27 NOTE — Progress Notes (Signed)
  04/27/2024  History of Present Illness: Calvin Greene is a 48 y.o. male presenting for follow-up of a left medial buttocks abscess.  He was last seen on 04/16/2024 at which time he had an area of erythema and induration measuring about 3.5 cm.  He was given a 10-day course of Augmentin  which she has completed and reports that the area is now fully healed and shrunken.  Denies any further pain in the area.  Past Medical History: Past Medical History:  Diagnosis Date   Thyroid disease      Past Surgical History: Past Surgical History:  Procedure Laterality Date   CYST REMOVAL NECK      Home Medications: Prior to Admission medications   Medication Sig Start Date End Date Taking? Authorizing Provider  levothyroxine (SYNTHROID) 75 MCG tablet Take 75 mcg by mouth daily.   Yes [provider]    Allergies: No Known Allergies  Review of Systems: Review of Systems  Constitutional:  Negative for chills and fever.  Respiratory:  Negative for shortness of breath.   Cardiovascular:  Negative for chest pain.  Gastrointestinal:  Negative for nausea and vomiting.  Skin:        Resolved left buttocks cellulitis and induration.    Physical Exam BP 115/73   Pulse 76   Ht 5' 11 (1.803 m)   Wt 175 lb (79.4 kg)   SpO2 98%   BMI 24.41 kg/m  CONSTITUTIONAL: No acute distress HEENT:  Normocephalic, atraumatic, extraocular motion intact. RESPIRATORY:  Normal respiratory effort without pathologic use of accessory muscles. CARDIOVASCULAR: Regular rhythm and rate. SKIN: Left medial buttocks area is not fully resolved without any erythema or induration.  There are some residual firmness consistent with the prior cyst infection and likely some scar tissue.  Otherwise no drainage, tenderness, or other concerns. NEUROLOGIC:  Motor and sensation is grossly normal.  Cranial nerves are grossly intact. PSYCH:  Alert and oriented to person, place and time. Affect is normal.   Assessment and  Plan: This is a 48 y.o. male with a resolved left buttocks abscess.  - Discussed with patient that the antibiotic course is fully resolved the infection and now there is only residual palpable firmness from the residual cyst and scar tissue.  This area is very small.  Discussed with him the potential options for watchful monitoring of this area versus proceeding with excision of this area in the future to prevent further recurrences.  Discussed with him that given the location, it would be better for him to have a week off from work so that he is not having to sit down continuously on the incision.  As such, he would prefer to wait until December to do the procedure while he will be off for Christmas. - Will schedule him for an office procedure for excision of the left buttock cyst on 06/29/2024.  All of his questions have been answered.  Return precautions given.  I spent 20 minutes dedicated to the care of this patient on the date of this encounter to include pre-visit review of records, face-to-face time with the patient discussing diagnosis and management, and any post-visit coordination of care.   Aloysius Sheree Plant, MD [facility]

## 2024-04-27 NOTE — Patient Instructions (Signed)
 We will schedule you for an excision of this area in office for December. If the area flairs up again do call us  so we may start you on antibiotics to treat any infections.   Pocket of Fluid in the Skin (Epidermoid Cyst): What to Know  An epidermoid cyst is a pocket of fluid that can form under your skin. It's filled with thick, oily substance that your skin glands make. These cysts occur anywhere on your body. They're usually harmless and don't cause problems unless they get inflamed or infected. What are the causes? An epidermoid cyst may be caused by: A blocked pore or hair follicle. An ingrown hair. This is a hair that curls and re-enters the skin instead of growing straight out of the skin. Skin irritation. Skin injuries. Some conditions that are passed from parent to child (inherited). Human papillomavirus (HPV). This is rare but can cause cysts on the bottom of the feet. Long-term (chronic) sun damage to the skin. What increases the risk? Having acne. Being male. Skin injuries. Being past puberty. Certain rare genetic disorders. What are the signs or symptoms? The only sign of this type of cyst may be a small, painless lump under the skin. When an epidermal cyst ruptures, it may become inflamed. Infections are rare, but symptoms may include: Redness. Inflammation. Tenderness. Warmth. Fever. A bad-smelling, grayish-white substance draining from the cyst. Pus draining from the cyst. How is this diagnosed? This condition is diagnosed with a physical exam. Sometimes, a tissue sample (biopsy) may need to be looked at under a microscope or tested for bacteria. You may be referred to a health care provider who specializes in skin care. This provider is called a dermatologist. How is this treated? If a cyst becomes inflamed, treatment may include: Opening and draining the cyst. Antibiotics. Steroid shots to lessen inflammation. Surgery to take out cysts that are large, painful. Do  not try to open or squeeze a cyst yourself. Follow these instructions at home: Medicines Take your medicines only as told. If you were given antibiotics, take them as told. Do not stop taking them even if you start to feel better. General instructions Keep the area around your cyst clean and dry. Wear loose, dry clothing. Avoid touching your cyst. Check the area around your cyst every day for signs of infection. Check for: Redness, swelling, or pain. Fluid or blood. Warmth. Pus or a bad smell. Keep all follow-up visits to make sure the cyst isn't becoming uncomfortable or infected. Contact a health care provider if: You have any signs of infection. Your cyst doesn't get better or gets worse. You get a cyst that looks different from other cysts you've had. You have a fever. You have redness that spreads from the cyst. This information is not intended to replace advice given to you by your health care provider. Make sure you discuss any questions you have with your health care provider. Document Revised: 02/21/2023 Document Reviewed: 02/11/2023 Elsevier Patient Education  2024 [employer].

## 2024-05-03 ENCOUNTER — Ambulatory Visit: Admitting: Dermatology

## 2024-06-12 ENCOUNTER — Ambulatory Visit: Admitting: Dermatology

## 2024-06-16 ENCOUNTER — Other Ambulatory Visit: Payer: Self-pay | Admitting: Surgery

## 2024-06-18 ENCOUNTER — Telehealth: Payer: Self-pay | Admitting: Surgery

## 2024-06-18 ENCOUNTER — Other Ambulatory Visit: Payer: Self-pay

## 2024-06-18 MED ORDER — AMOXICILLIN-POT CLAVULANATE 875-125 MG PO TABS: 1.0000 | ORAL_TABLET | Freq: Two times a day (BID) | ORAL | 0 refills | Status: DC

## 2024-06-18 NOTE — Telephone Encounter (Signed)
 Pt said that he is out of the antiobotic you have given him and the procedure has came back and he is asking to get refill and will reschedule his procedure after Christmas. PT# [phone] pharm is [facility] in [place].

## 2024-06-19 MED ORDER — AMOXICILLIN-POT CLAVULANATE 875-125 MG PO TABS: 1.0000 | ORAL_TABLET | Freq: Two times a day (BID) | ORAL | 0 refills | Status: AC

## 2024-06-19 NOTE — Addendum Note (Signed)
 Addended by: Pamella Samons, CARYL-LYN M on: 06/19/2024 10:19 AM   Modules accepted: Orders

## 2024-06-29 ENCOUNTER — Ambulatory Visit: Admitting: Surgery

## 2024-07-27 ENCOUNTER — Encounter: Payer: Self-pay | Admitting: Surgery

## 2024-07-27 ENCOUNTER — Ambulatory Visit: Admitting: Surgery

## 2024-07-27 VITALS — BP 114/70 | HR 79 | Ht 71.0 in | Wt 175.0 lb

## 2024-07-27 DIAGNOSIS — L728 Other follicular cysts of the skin and subcutaneous tissue: Secondary | ICD-10-CM | POA: Diagnosis not present

## 2024-07-27 DIAGNOSIS — L0231 Cutaneous abscess of buttock: Secondary | ICD-10-CM

## 2024-07-27 NOTE — Patient Instructions (Addendum)
 We have removed a Cyst in our office today.  You have sutures under the skin that will dissolve and also dermabond (skin glue) on top of your skin which will come off on it's own in 10-14 days.  You may use Ibuprofen  or Tylenol  as needed for pain control. Use the ice pack 3-4 times a day for the next two days for any achiness.  You may shower tomorrow. Do not scrub at the area.  Your incision was closed with Dermabond. It is best to keep it clean and dry, it will tolerate a brief shower, but do not soak it or apply any creams or lotions to the incisions. The Dermabond should gradually flake off over time. Keep it open to air so you can evaluate your incisions. Dermabond assists the underlying sutures to keep your incision closed and protected from infection. Should you develop some drainage from your incision, some drops of drainage would be okay but if it persists continue to put keep a dry dressing over it.  Avoid Strenuous activities that will make you sweat during the next 48 hours to avoid the glue coming off prematurely. Avoid activities that will place pressure to this area of the body for 1-2 weeks to avoid re-injury to incision site.  Please see your follow-up appointment provided. We will see you back in office to make sure this area is healed and to review the final pathology. If you have any questions or concerns prior to this appointment, call our office and speak with a nurse.    Excision of Skin Cysts or Lesions Excision of a skin lesion refers to the removal of a section of skin by making small cuts (incisions) in the skin. This procedure may be done to remove a cancerous (malignant) or noncancerous (benign) growth on the skin. It is typically done to treat or prevent cancer or infection. It may also be done to improve cosmetic appearance. The procedure may be done to remove: Cancerous growths, such as basal cell carcinoma, squamous cell carcinoma, or melanoma. Noncancerous  growths, such as a cyst or lipoma. Growths, such as moles or skin tags, which may be removed for cosmetic reasons.  Various excision or surgical techniques may be used depending on your condition, the location of the lesion, and your overall health. Tell a health care provider about: Any allergies you have. All medicines you are taking, including vitamins, herbs, eye drops, creams, and over-the-counter medicines. Any problems you or family members have had with anesthetic medicines. Any blood disorders you have. Any surgeries you have had. Any medical conditions you have. Whether you are pregnant or may be pregnant. What are the risks? Generally, this is a safe procedure. However, problems may occur, including: Bleeding. Infection. Scarring. Recurrence of the cyst, lipoma, or cancer. Changes in skin sensation or appearance, such as discoloration or swelling. Reaction to the anesthetics. Allergic reaction to surgical materials or ointments. Damage to nerves, blood vessels, muscles, or other structures. Continued pain.  What happens before the procedure? Ask your health care provider about: Changing or stopping your regular medicines. This is especially important if you are taking diabetes medicines or blood thinners. Taking medicines such as aspirin and ibuprofen . These medicines can thin your blood. Do not take these medicines before your procedure if your health care provider instructs you not to. You may be asked to take certain medicines. You may be asked to stop smoking. You may have an exam or testing. What happens during the procedure? To  reduce your risk of infection: Your health care team will wash or sanitize their hands. Your skin will be washed with soap. You will be given a medicine to numb the area (local anesthetic). One of the following excision techniques will be performed. At the end of any of these procedures, antibiotic ointment will be applied as needed. Each  of the following techniques may vary among health care providers and hospitals. Complete Surgical Excision The area of skin that needs to be removed will be marked with a pen. Using a small scalpel or scissors, the surgeon will gently cut around and under the lesion until it is completely removed. The lesion will be placed in a fluid and sent to the lab for examination. If necessary, bleeding will be controlled with a device that delivers heat (electrocautery). The edges of the wound may be stitched (sutured) together, and a bandage (dressing) or surgical glue will be applied. This procedure may be performed to treat a cancerous growth or a noncancerous cyst or lesion.  What happens after the procedure? Return to your normal activities as told by your health care provider. Report any excessive bleeding, spreading redness, or increased pain.

## 2024-07-27 NOTE — Progress Notes (Signed)
" °  Procedure Date:  07/27/2024  Pre-operative Diagnosis:  Left buttocks infected cyst  Post-operative Diagnosis:  Left buttocks infected cyst, 3.2 x 1.7 cm  Procedure:  Excision of left buttocks infected cyst; layered closure of 2.5 cm incision.  Surgeon:  Aloysius Sheree Plant, MD  Anesthesia:  15 ml of 1% lidocaine with epi.  Estimated Blood Loss:  10 ml  Specimens:  left buttocks cyst  Complications:  None  Indications for Procedure:  This is a 49 y.o. male with diagnosis of a left buttocks cyst which has been infected in the past, requiring I&D.  The patient wishes to have this excised. The risks of bleeding, abscess or infection, injury to surrounding structures, and need for further procedures were all discussed with the patient and he was willing to proceed.  Description of Procedure: The patient was correctly identified at bedside.  The patient was placed prone.  Appropriate time-outs were performed.  The patient's left buttocks was prepped and draped in usual sterile fashion.  Local anesthetic was infused intradermally.  A vertical elliptical 2.5 cm incision was made over the cyst, and scalpel was used to dissect down the skin and subcutaneous tissue.  Skin flaps were created sharply, but it was noted that the cyst was more oval size going transversely instead of what seemed to be a palpable circular type of mass.  As such, we dissected laterally and more so medially to be able to expose the entire cyst.  After further dissection, the cyst was excised and sent off to pathology.  The cavity was then irrigated and hemostasis was assured with manual pressure.  The wound was then closed in two layers using 3-0 Vicryl and 4-0 Monocryl.  The incision was cleaned and sealed with DermaBond.  The patient tolerated the procedure well and all sharps were appropriately disposed of at the end of the case.  --Patient may shower tomorrow --May take Tylenol or ibuprofen  for pain control --Activity  restrictions discussed. --Follow up in 2 weeks.  Aloysius Sheree Plant, MD    "

## 2024-07-31 LAB — SURGICAL PATHOLOGY

## 2024-08-02 ENCOUNTER — Ambulatory Visit: Admitting: Dermatology

## 2024-08-10 ENCOUNTER — Encounter: Admitting: Surgery
